# Patient Record
Sex: Female | Born: 1980 | Race: White | Hispanic: No | Marital: Married | Smoking: Never smoker
Health system: Southern US, Community
[De-identification: ages and names within clinical notes are randomized; demographics above are authoritative.]

---
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
## 2003-10-27 ENCOUNTER — Other Ambulatory Visit: Admission: RE | Admit: 2003-10-27 | Discharge: 2003-10-27 | Payer: Self-pay | Admitting: Gynecology

## 2005-04-14 ENCOUNTER — Other Ambulatory Visit: Admission: RE | Admit: 2005-04-14 | Discharge: 2005-04-14 | Payer: Self-pay | Admitting: Obstetrics and Gynecology

## 2005-09-19 ENCOUNTER — Other Ambulatory Visit: Admission: RE | Admit: 2005-09-19 | Discharge: 2005-09-19 | Payer: Self-pay | Admitting: Obstetrics and Gynecology

## 2006-03-07 ENCOUNTER — Inpatient Hospital Stay (HOSPITAL_COMMUNITY): Admission: AD | Admit: 2006-03-07 | Discharge: 2006-03-11 | Payer: Self-pay | Admitting: Obstetrics and Gynecology

## 2006-03-08 ENCOUNTER — Encounter (INDEPENDENT_AMBULATORY_CARE_PROVIDER_SITE_OTHER): Payer: Self-pay | Admitting: Specialist

## 2010-07-21 ENCOUNTER — Inpatient Hospital Stay (HOSPITAL_COMMUNITY): Admission: RE | Admit: 2010-07-21 | Discharge: 2010-07-23 | Payer: Self-pay | Admitting: Obstetrics and Gynecology

## 2011-01-19 LAB — CBC
HCT: 28.3 % — ABNORMAL LOW (ref 36.0–46.0)
Hemoglobin: 9.8 g/dL — ABNORMAL LOW (ref 12.0–15.0)
MCH: 29.7 pg (ref 26.0–34.0)
MCHC: 34.2 g/dL (ref 30.0–36.0)
MCHC: 34.6 g/dL (ref 30.0–36.0)
MCV: 85.9 fL (ref 78.0–100.0)
MCV: 85 fL (ref 78.0–100.0)
Platelets: 176 10*3/uL (ref 150–400)
Platelets: 237 10*3/uL (ref 150–400)
RBC: 3.29 MIL/uL — ABNORMAL LOW (ref 3.87–5.11)
RBC: 4.11 MIL/uL (ref 3.87–5.11)
RDW: 12.9 % (ref 11.5–15.5)
RDW: 13.2 % (ref 11.5–15.5)
RDW: 13 % (ref 11.5–15.5)
WBC: 7.9 10*3/uL (ref 4.0–10.5)
WBC: 8 10*3/uL (ref 4.0–10.5)

## 2011-01-19 LAB — URINALYSIS, ROUTINE W REFLEX MICROSCOPIC
Hgb urine dipstick: NEGATIVE
Specific Gravity, Urine: 1.02 (ref 1.005–1.030)
Urobilinogen, UA: 0.2 mg/dL (ref 0.0–1.0)

## 2011-01-19 LAB — SURGICAL PCR SCREEN
MRSA, PCR: NEGATIVE
Staphylococcus aureus: NEGATIVE

## 2011-01-19 LAB — COMPREHENSIVE METABOLIC PANEL
ALT: 10 U/L (ref 0–35)
AST: 15 U/L (ref 0–37)
Calcium: 8.7 mg/dL (ref 8.4–10.5)
GFR calc Af Amer: >60 mL/min (ref >60)
Glucose, Bld: 100 mg/dL — ABNORMAL HIGH (ref 70–99)
Sodium: 135 mEq/L (ref 135–145)
Total Protein: 5.4 g/dL — ABNORMAL LOW (ref 6.0–8.3)

## 2011-01-19 LAB — LACTATE DEHYDROGENASE: LDH: 143 U/L (ref 94–250)

## 2011-01-19 LAB — RPR: RPR Ser Ql: NONREACTIVE

## 2011-03-24 NOTE — Discharge Summary (Signed)
[name], Jo Schmitt             ACCOUNT NO.:  [id number]   MEDICAL RECORD NO.:  [id number]          PATIENT TYPE:  INP   LOCATION:  9134                          FACILITY:  [facility]   PHYSICIAN:  Duke Salvia. Marcelle Overlie, M.D.DATE OF BIRTH:  Jan 01, 1981   DATE OF ADMISSION:  03/07/2006  DATE OF DISCHARGE:  03/11/2006                                 DISCHARGE SUMMARY   ADMISSION DIAGNOSES:  1.  Intrauterine pregnancy at 57 weeks estimated gestational age.  2.  Oligohydramnios.   DISCHARGE DIAGNOSES:  1.  Status post low transverse cesarean section secondary to failed      induction.  2.  Viable female infant.   PROCEDURE:  Primary low transverse cesarean section.   REASON FOR ADMISSION:  Please see written history and physical.   HOSPITAL COURSE:  The patient is a 30 year old primigravida that was  admitted to [facility] at 36 weeks estimated gestational  age. The patient had been seen in the office for amniotic fluid index that  was less than the third percentile. The patient was now admitted for  induction of labor. After Cervidil, ripening of the cervix, and Pitocin  augmentation, the patient failed to change her cervix. Cervix remained  closed, 50% effaced, vertex presentation. Fetal heart tones were reactive  after a failed induction on the second day. Decision was made to proceed  with a primary low transverse cesarean section. The patient was then  transferred to the operating room where spinal anesthesia was administered  without difficulty. A low transverse incision was made to deliver the viable  female infant weighing 5 pounds, 5 ounces, Apgar's of 5 at 1 minute, 9 at 10  minutes. The patient tolerated the procedure well and was taken to the  recovery room in stable condition. On postoperative day 1, the patient was  without complaints. The baby was stable in the NICU on C-PAP. Vital signs  were stable. She was afebrile. Abdomen was soft with good return of  bowel  function. Fundus was firm and nontender. Abdominal dressing was noted to be  clean, dry, and intact. Laboratory findings revealed hemoglobin 10.2,  platelet count 270,000. White blood cell count 12.5. On postoperative day 2,  the patient was without complaints. Vital signs remained stable. She was  afebrile. Fundus was firm and nontender. Abdominal dressing had been removed  revealing incision that was clean, dry, and intact. Laboratory findings  revealed hemoglobin stable at 10.2. On postoperative day 3, the patient was  without complaints. Vital signs remained stable. Fundus was firm and  nontender. Incision was clean, dry, and intact. Staples were removed and  patient was later discharged home.   CONDITION ON DISCHARGE:  Good.   DIET:  Regular as tolerated.   ACTIVITY:  No heavy lifting. No driving x2 weeks or vaginal entry.   FOLLOWUP:  The patient to followup in the office in 1 to 2 weeks for  incision check. She is to call for temperature greater than 100 degrees,  persistent nausea and vomiting, heavy vaginal bleeding, and/or redness or  drainage from incisional site.   DISCHARGE MEDICATIONS:  1.  Tylox #30 1 p.o. q.4 to 6 hours p.r.n.  2.  Motrin 600 mg every 6 hours.  3.  Prenatal vitamins 1 p.o. daily.  4.  Colace 1 p.o. daily p.r.n.      Julio Sicks, N.P.      Richard M. Marcelle Overlie, M.D.  Electronically Signed    CC/MEDQ  D:  03/29/2006  T:  03/29/2006  Job:  [id number]

## 2011-03-24 NOTE — Op Note (Signed)
NAMEAVION, PATELLA NO.:  [id number]   MEDICAL RECORD NO.:  [id number]          PATIENT TYPE:  INP   LOCATION:  9134                          FACILITY:  [facility]   PHYSICIAN:  Juluis Mire, M.D.   DATE OF BIRTH:  [birth year]   DATE OF PROCEDURE:  03/08/2006  DATE OF DISCHARGE:                                 OPERATIVE REPORT   PREOPERATIVE DIAGNOSIS:  Intrauterine pregnancy at 36 weeks with  oligohydramnios and failed induction.   POSTOPERATIVE DIAGNOSIS:  Intrauterine pregnancy at 36 weeks with  oligohydramnios and failed induction.   OPERATIVE PROCEDURE:  Low transverse cesarean section.   SURGEON:  Juluis Mire, M.D.   ANESTHESIA:  Spinal.   ESTIMATED BLOOD LOSS:  400-500 mL.   PACKS AND DRAINS:  None.   INTRAOPERATIVE BLOOD REPLACED:  None.   COMPLICATIONS:  None.   INDICATIONS:  The patient is a 30 year old primigravida female at 2 weeks.  Came to the office where amniotic fluid index was less than the third  percentile.  She was admitted for induction.  After Cervidil ripened the  cervix and Pitocin all day up to 20 MU/min, the cervix failed to changed,  remained 50% and closed, vertex presenting.  Fluid remained intact.  Fetal  heart rate was stable.  We had discussed a second day of induction versus  primary cesarean section.  The patient was favoring the latter.  The risks  were discussed including the risk of infection.  Risk of hemorrhage could  require transfusion, risk of AIDS or hepatitis.  The risk of injury to  adjacent organs requiring further exploratory surgery.  The risk of deep  vein thrombosis and pulmonary embolus.  The patient expressed understanding  of the indications and risks.   PROCEDURE:  The patient was taken to the OR and placed in supine position  with left lateral tilt.  After satisfactory level of spinal anesthesia was  obtained, the abdomen was prepped out with Betadine and draped in a sterile  field.  A low  transverse skin incision was made with a knife and carried  through the subcutaneous tissue.  The fascia was entered sharply and  incision to the fascia laterally.  The fascia was taken off the muscles  superiorly and inferiorly.  Rectus muscles were separated in the midline.  The peritoneum was entered sharply and the incision in the peritoneum was  extended both superiorly and inferiorly.  Low transverse bladder flap was  developed.  A low transverse uterine incision was begun with a knife and  extended laterally using manual traction.  Amniotic fluid was clear.  The  infant presented in the vertex presentation.  It was delivered with  elevation of the head and fundal pressure.  The infant was a viable female.  Apgars were 5/9.  pH and weight are pending.  The placenta was delivered  manually and sent for pathologic review.  The uterus was wiped free of  remaining membranes and placenta.  The uterus was then closed with running  locking suture of 0 chromic using a two-layer closure technique.  We had  good hemostasis and clear urine output.  Tubes and ovaries were  unremarkable.  The muscles and peritoneum were closed with a running suture  of 3-0 Vicryl.  The fascia was closed with a running suture of 0 PDS.  The  skin was closed  with staples and Steri-Strips.  Sponge, instrument and needle count reported  as correct by the circulating nurse x 2.  The patient tolerated the  procedure well and returned to the recovery room in good condition.  Urine  output remained clear at the time of closure.      Juluis Mire, M.D.  Electronically Signed     JSM/MEDQ  D:  03/08/2006  T:  03/09/2006  Job:  [id number]

## 2016-12-13 ENCOUNTER — Ambulatory Visit: Payer: Self-pay | Admitting: Physician Assistant

## 2016-12-13 VITALS — BP 110/70 | HR 97 | Temp 98.1°F

## 2016-12-13 DIAGNOSIS — B349 Viral infection, unspecified: Secondary | ICD-10-CM

## 2016-12-13 NOTE — Progress Notes (Signed)
S: C/o hoarse voice with dry cough for 5 days, + fever, chills this weekend but none in 2 days, denies cp/sob, v/d; mucus was green this am but clear throughout the day, cough is sporadic, did get a flu vaccine  Using otc meds:   O: PE: vitals wnl, nad,  perrl eomi, normocephalic, tms dull, nasal mucosa red and swollen, throat injected, neck supple no lymph, lungs c t a, cv rrr, neuro intact,   A:  Acute viral illness   P: drink fluids, continue regular meds , use otc meds of choice, return if not improving in 5 days, return earlier if worsening , if voice is not back to normal by Monday 2-12 then will call in a steroid

## 2017-01-29 ENCOUNTER — Other Ambulatory Visit (HOSPITAL_COMMUNITY)
Admission: RE | Admit: 2017-01-29 | Discharge: 2017-01-29 | Disposition: A | Discharge status: Home / Self Care | Payer: Managed Care, Other (non HMO) | Source: Ambulatory Visit | Attending: Family Medicine | Admitting: Family Medicine

## 2017-01-29 ENCOUNTER — Other Ambulatory Visit: Payer: Self-pay | Admitting: Family Medicine

## 2017-01-29 DIAGNOSIS — Z124 Encounter for screening for malignant neoplasm of cervix: Secondary | ICD-10-CM | POA: Diagnosis present

## 2017-01-29 DIAGNOSIS — Z1151 Encounter for screening for human papillomavirus (HPV): Secondary | ICD-10-CM | POA: Diagnosis not present

## 2017-01-31 LAB — CYTOLOGY - PAP
DIAGNOSIS: NEGATIVE
HPV (WINDOPATH): NOT DETECTED

## 2018-12-30 ENCOUNTER — Ambulatory Visit: Payer: Self-pay | Admitting: Adult Health

## 2018-12-30 VITALS — BP 126/82 | HR 98 | Temp 98.4°F | Resp 16 | Ht 65.0 in | Wt 275.0 lb

## 2018-12-30 DIAGNOSIS — H6503 Acute serous otitis media, bilateral: Secondary | ICD-10-CM | POA: Insufficient documentation

## 2018-12-30 DIAGNOSIS — J0100 Acute maxillary sinusitis, unspecified: Secondary | ICD-10-CM

## 2018-12-30 MED ORDER — PREDNISONE 10 MG (21) PO TBPK: ORAL_TABLET | ORAL | Status: DC

## 2018-12-30 MED ORDER — BENZONATATE 200 MG PO CAPS: 200.0000 mg | ORAL_CAPSULE | Freq: Every evening | ORAL | 0 refills | Status: DC | PRN

## 2018-12-30 MED ORDER — AMOXICILLIN-POT CLAVULANATE 875-125 MG PO TABS: 1.0000 | ORAL_TABLET | Freq: Two times a day (BID) | ORAL | 0 refills | Status: DC

## 2018-12-30 NOTE — Progress Notes (Addendum)
Subjective:     Patient ID: Jo Schmitt, female   DOB: 05-18-1981, 38 y.o.   MRN: [id number]  Blood pressure 126/82, pulse 98, temperature 98.4 F (36.9 C), resp. rate 16, height 5\' 5"  (1.651 m), weight 275 lb (124.7 kg), SpO2 100 %.  Patient is a 38 year old female in no acute distress who comes to the clinic for complaints of sinus congestion x 1 month.   He reports she has tried on and off home remedies for the last month including essential oils and NyQuil. Patient's last menstrual period was 12/25/2018 (exact date).  Patient denies any chance of pregnancy.  Sinusitis  This is a new problem. The current episode started more than 1 month ago. The problem has been gradually improving since onset. The maximum temperature recorded prior to her arrival was 100.4 - 100.9 F. The fever has been present for 1 to 2 days. She is experiencing no pain. Associated symptoms include coughing, ear pain, sinus pressure, sneezing, a sore throat and swollen glands. Pertinent negatives include no chills, congestion, diaphoresis, headaches, hoarse voice, neck pain or shortness of breath. Past treatments include oral decongestants. The treatment provided no relief.    Reports postnasal drip cough.  He has had fatigue the last week with worsening facial pressure in the maxillary area bilaterally  Patient  denies any fever, body aches,chills, rash, chest pain, shortness of breath, nausea, vomiting, or diarrhea.       Review of Systems  Constitutional: Positive for fatigue. Negative for activity change, appetite change, chills, diaphoresis, fever and unexpected weight change.  HENT: Positive for ear pain, postnasal drip, rhinorrhea, sinus pressure, sneezing and sore throat. Negative for congestion, dental problem, drooling, ear discharge, facial swelling, hearing loss, hoarse voice, mouth sores, nosebleeds, sinus pain, tinnitus, trouble swallowing and voice change.   Eyes: Negative.   Respiratory: Positive  for cough. Negative for apnea, choking, chest tightness, shortness of breath, wheezing and stridor.   Cardiovascular: Negative.  Negative for chest pain, palpitations and leg swelling.  Gastrointestinal: Negative.   Genitourinary: Negative.   Musculoskeletal: Negative.  Negative for neck pain.  Skin: Negative.   Allergic/Immunologic: Negative.   Neurological: Negative.  Negative for headaches.  Hematological: Negative.   Psychiatric/Behavioral: Negative.        Objective:   Physical Exam Vitals signs reviewed.  Constitutional:      General: She is not in acute distress.    Appearance: Normal appearance. She is well-developed and well-groomed. She is not ill-appearing or diaphoretic.     Comments: Patient is alert and oriented and responsive to questions Engages in eye contact with provider. Speaks in full sentences without any pauses without any shortness of breath or distress.    HENT:     Head: Normocephalic and atraumatic.     Salivary Glands: Right salivary gland is not diffusely enlarged or tender. Left salivary gland is not diffusely enlarged or tender.     Right Ear: Hearing, ear canal and external ear normal. A middle ear effusion is present. Tympanic membrane is erythematous. Tympanic membrane is not perforated or retracted.     Left Ear: Hearing, ear canal and external ear normal. A middle ear effusion is present. Tympanic membrane is erythematous. Tympanic membrane is not perforated or retracted.     Nose: Mucosal edema, congestion and rhinorrhea present.     Right Sinus: Maxillary sinus tenderness present. No frontal sinus tenderness.     Left Sinus: Maxillary sinus tenderness present. No frontal sinus  tenderness.     Mouth/Throat:     Lips: Pink.     Mouth: Mucous membranes are moist.     Pharynx: Uvula midline. Posterior oropharyngeal erythema present. No uvula swelling.     Tonsils: No tonsillar exudate or tonsillar abscesses.  Eyes:     General: No scleral icterus.        Right eye: No discharge.        Left eye: No discharge.     Conjunctiva/sclera: Conjunctivae normal.     Pupils: Pupils are equal, round, and reactive to light.  Neck:     Musculoskeletal: Full passive range of motion without pain, normal range of motion and neck supple. No neck rigidity or muscular tenderness.     Trachea: Trachea and phonation normal.  Cardiovascular:     Rate and Rhythm: Normal rate and regular rhythm.     Heart sounds: No murmur. No friction rub. No gallop.   Pulmonary:     Effort: Pulmonary effort is normal. No respiratory distress.     Breath sounds: Normal breath sounds. No stridor. No wheezing, rhonchi or rales.  Chest:     Chest wall: No tenderness.  Abdominal:     Palpations: Abdomen is soft.  Musculoskeletal: Normal range of motion.     Comments: Patient moves on and off of exam table and in room without difficulty. Gait is normal in hall and in room. Patient is oriented to person place time and situation. Patient answers questions appropriately and engages in conversation.   Lymphadenopathy:     Cervical: Cervical adenopathy present.     Right cervical: Superficial cervical adenopathy present. No deep or posterior cervical adenopathy.    Left cervical: Superficial cervical adenopathy present. No deep or posterior cervical adenopathy.  Skin:    General: Skin is warm and dry.     Capillary Refill: Capillary refill takes less than 2 seconds.  Neurological:     General: No focal deficit present.     Mental Status: She is alert and oriented to person, place, and time.  Psychiatric:        Mood and Affect: Mood normal.        Behavior: Behavior normal. Behavior is cooperative.        Thought Content: Thought content normal.        Judgment: Judgment normal.        Assessment:     Acute non-recurrent maxillary sinusitis  Non-recurrent acute serous otitis media of both ears      Plan:     Method of birth control recommended while on  antibiotic in 1 week thereafter.  Flonase nasal spray per package instructions as well as Claritin daily per package instructions as recommended with allergy season approaching and patient reports history of allergies in the spring  Diagnoses and all orders for this visit:  Acute non-recurrent maxillary sinusitis  Non-recurrent acute serous otitis media of both ears  Other orders -     amoxicillin-clavulanate (AUGMENTIN) 875-125 MG tablet; Take 1 tablet by mouth 2 (two) times daily. -     predniSONE (STERAPRED UNI-PAK 21 TAB) 10 MG (21) TBPK tablet; PO: Take 6 tablets on day 1:Take 5 tablets day 2:Take 4 tablets day 3: Take 3 tablets day 4:Take 2 tablets day five: 5 Take 1 tablet day 6 -     benzonatate (TESSALON) 200 MG capsule; Take 1 capsule (200 mg total) by mouth at bedtime as needed for cough.   Gave and reviewed After  Visit Summary(AVS) with patient. Patient is advised to read the after visit summary as well and let the provider know if any question, concerns or clarifications are needed.   Patient is advised to keep regular primary care visits with her primary care provider.  Advised patient call the office or your primary care doctor for an appointment if no improvement within 72 hours or if any symptoms change or worsen at any time  Advised ER or urgent Care if after hours or on weekend. Call 911 for emergency symptoms at any time.Patinet verbalized understanding of all instructions given/reviewed and treatment plan and has no further questions or concerns at this time.    Patient verbalized understanding of all instructions given and denies any further questions at this time.

## 2018-12-30 NOTE — Patient Instructions (Signed)
Sinusitis, Adult Sinusitis is soreness and swelling (inflammation) of your sinuses. Sinuses are hollow spaces in the bones around your face. They are located:  Around your eyes.  In the middle of your forehead.  Behind your nose.  In your cheekbones. Your sinuses and nasal passages are lined with a fluid called mucus. Mucus drains out of your sinuses. Swelling can trap mucus in your sinuses. This lets germs (bacteria, virus, or fungus) grow, which leads to infection. Most of the time, this condition is caused by a virus. What are the causes? This condition is caused by:  Allergies.  Asthma.  Germs.  Things that block your nose or sinuses.  Growths in the nose (nasal polyps).  Chemicals or irritants in the air.  Fungus (rare). What increases the risk? You are more likely to develop this condition if:  You have a weak body defense system (immune system).  You do a lot of swimming or diving.  You use nasal sprays too much.  You smoke. What are the signs or symptoms? The main symptoms of this condition are pain and a feeling of pressure around the sinuses. Other symptoms include:  Stuffy nose (congestion).  Runny nose (drainage).  Swelling and warmth in the sinuses.  Headache.  Toothache.  A cough that may get worse at night.  Mucus that collects in the throat or the back of the nose (postnasal drip).  Being unable to smell and taste.  Being very tired (fatigue).  A fever.  Sore throat.  Bad breath. How is this diagnosed? This condition is diagnosed based on:  Your symptoms.  Your medical history.  A physical exam.  Tests to find out if your condition is short-term (acute) or long-term (chronic). Your doctor may: ? Check your nose for growths (polyps). ? Check your sinuses using a tool that has a light (endoscope). ? Check for allergies or germs. ? Do imaging tests, such as an MRI or CT scan. How is this treated? Treatment for this condition  depends on the cause and whether it is short-term or long-term.  If caused by a virus, your symptoms should go away on their own within 10 days. You may be given medicines to relieve symptoms. They include: ? Medicines that shrink swollen tissue in the nose. ? Medicines that treat allergies (antihistamines). ? A spray that treats swelling of the nostrils. ? Rinses that help get rid of thick mucus in your nose (nasal saline washes).  If caused by bacteria, your doctor may wait to see if you will get better without treatment. You may be given antibiotic medicine if you have: ? A very bad infection. ? A weak body defense system.  If caused by growths in the nose, you may need to have surgery. Follow these instructions at home: Medicines  Take, use, or apply over-the-counter and prescription medicines only as told by your doctor. These may include nasal sprays.  If you were prescribed an antibiotic medicine, take it as told by your doctor. Do not stop taking the antibiotic even if you start to feel better. Hydrate and humidify   Drink enough water to keep your pee (urine) pale yellow.  Use a cool mist humidifier to keep the humidity level in your home above 50%.  Breathe in steam for 10-15 minutes, 3-4 times a day, or as told by your doctor. You can do this in the bathroom while a hot shower is running.  Try not to spend time in cool or dry air.   Rest  Rest as much as you can.  Sleep with your head raised (elevated).  Make sure you get enough sleep each night. General instructions   Put a warm, moist washcloth on your face 3-4 times a day, or as often as told by your doctor. This will help with discomfort.  Wash your hands often with soap and water. If there is no soap and water, use hand sanitizer.  Do not smoke. Avoid being around people who are smoking (secondhand smoke).  Keep all follow-up visits as told by your doctor. This is important. Contact a doctor if:  You  have a fever.  Your symptoms get worse.  Your symptoms do not get better within 10 days. Get help right away if:  You have a very bad headache.  You cannot stop throwing up (vomiting).  You have very bad pain or swelling around your face or eyes.  You have trouble seeing.  You feel confused.  Your neck is stiff.  You have trouble breathing. Summary  Sinusitis is swelling of your sinuses. Sinuses are hollow spaces in the bones around your face.  This condition is caused by tissues in your nose that become inflamed or swollen. This traps germs. These can lead to infection.  If you were prescribed an antibiotic medicine, take it as told by your doctor. Do not stop taking it even if you start to feel better.  Keep all follow-up visits as told by your doctor. This is important. This information is not intended to replace advice given to you by your health care provider. Make sure you discuss any questions you have with your health care provider. Document Released: 04/10/2008 Document Revised: 03/25/2018 Document Reviewed: 03/25/2018 Elsevier Interactive Patient Education  2019 [employer]   Benzonatate capsules What is this medicine? BENZONATATE (ben ZOE na tate) is used to treat cough. This medicine may be used for other purposes; ask your health care provider or pharmacist if you have questions. COMMON BRAND NAME(S): Tessalon Perles, Zonatuss What should I tell my health care provider before I take this medicine? They need to know if you have any of these conditions: -kidney or liver disease -an unusual or allergic reaction to benzonatate, anesthetics, other medicines, foods, dyes, or preservatives -pregnant or trying to get pregnant -breast-feeding How should I use this medicine? Take this medicine by mouth with a glass of water. Follow the directions on the prescription label. Avoid breaking, chewing, or sucking the capsule, as this can cause serious side effects. Take  your medicine at regular intervals. Do not take your medicine more often than directed. Talk to your pediatrician regarding the use of this medicine in children. While this drug may be prescribed for children as young as 54 years old for selected conditions, precautions do apply. Overdosage: If you think you have taken too much of this medicine contact a poison control center or emergency room at once. NOTE: This medicine is only for you. Do not share this medicine with others. What if I miss a dose? If you miss a dose, take it as soon as you can. If it is almost time for your next dose, take only that dose. Do not take double or extra doses. What may interact with this medicine? Do not take this medicine with any of the following medications: -MAOIs like Carbex, Eldepryl, Marplan, Nardil, and Parnate This list may not describe all possible interactions. Give your health care provider a list of all the medicines, herbs, non-prescription drugs, or  dietary supplements you use. Also tell them if you smoke, drink alcohol, or use illegal drugs. Some items may interact with your medicine. What should I watch for while using this medicine? Tell your doctor if your symptoms do not improve or if they get worse. If you have a high fever, skin rash, or headache, see your health care professional. You may get drowsy or dizzy. Do not drive, use machinery, or do anything that needs mental alertness until you know how this medicine affects you. Do not sit or stand up quickly, especially if you are an older patient. This reduces the risk of dizzy or fainting spells. What side effects may I notice from receiving this medicine? Side effects that you should report to your doctor or health care professional as soon as possible: -allergic reactions like skin rash, itching or hives, swelling of the face, lips, or tongue -breathing problems -chest pain -confusion or hallucinations -irregular heartbeat -numbness of  mouth or throat -seizures Side effects that usually do not require medical attention (report to your doctor or health care professional if they continue or are bothersome): -burning feeling in the eyes -constipation -headache -nasal congestion -stomach upset This list may not describe all possible side effects. Call your doctor for medical advice about side effects. You may report side effects to FDA at 1-800-FDA-1088. Where should I keep my medicine? Keep out of the reach of children. Store at room temperature between 15 and 30 degrees C (59 and 86 degrees F). Keep tightly closed. Protect from light and moisture. Throw away any unused medicine after the expiration date. NOTE: This sheet is a summary. It may not cover all possible information. If you have questions about this medicine, talk to your doctor, pharmacist, or health care provider.  2019 Elsevier/Gold Standard (2008-01-22 14:52:56) Prednisolone tablets What is this medicine? PREDNISOLONE (pred NISS oh lone) is a corticosteroid. It is commonly used to treat inflammation of the skin, joints, lungs, and other organs. Common conditions treated include asthma, allergies, and arthritis. It is also used for other conditions, such as blood disorders and diseases of the adrenal glands. This medicine may be used for other purposes; ask your health care provider or pharmacist if you have questions. COMMON BRAND NAME(S): Millipred, Millipred DP, Millipred DP 12-Day, Millipred DP 6 Day, Prednoral What should I tell my health care provider before I take this medicine? They need to know if you have any of these conditions: -Cushing's syndrome -diabetes -glaucoma -heart problems or disease -high blood pressure -infection such as herpes, measles, tuberculosis, or chickenpox -kidney disease -liver disease -mental problems -myasthenia gravis -osteoporosis -seizures -stomach ulcer or intestine disease including colitis and  diverticulitis -thyroid problem -an unusual or allergic reaction to lactose, prednisolone, other medicines, foods, dyes, or preservatives -pregnant or trying to get pregnant -breast-feeding How should I use this medicine? Take this medicine by mouth with a glass of water. Follow the directions on the prescription label. Take it with food or milk to avoid stomach upset. If you are taking this medicine once a day, take it in the morning. Do not take more medicine than you are told to take. Do not suddenly stop taking your medicine because you may develop a severe reaction. Your doctor will tell you how much medicine to take. If your doctor wants you to stop the medicine, the dose may be slowly lowered over time to avoid any side effects. Talk to your pediatrician regarding the use of this medicine in children. Special care  may be needed. Overdosage: If you think you have taken too much of this medicine contact a poison control center or emergency room at once. NOTE: This medicine is only for you. Do not share this medicine with others. What if I miss a dose? If you miss a dose, take it as soon as you can. If it is almost time for your next dose, take only that dose. Do not take double or extra doses. What may interact with this medicine? Do not take this medicine with any of the following medications: -metyrapone -mifepristone This medicine may also interact with the following medications: -aminoglutethimide -amphotericin B -aspirin and aspirin-like medicines -barbiturates -certain medicines for diabetes, like glipizide or glyburide -cholestyramine -cholinesterase inhibitors -cyclosporine -digoxin -diuretics -ephedrine -female hormones, like estrogens and birth control pills -isoniazid -ketoconazole -NSAIDS, medicines for pain and inflammation, like ibuprofen or naproxen -phenytoin -rifampin -toxoids -vaccines -warfarin This list may not describe all possible interactions. Give  your health care provider a list of all the medicines, herbs, non-prescription drugs, or dietary supplements you use. Also tell them if you smoke, drink alcohol, or use illegal drugs. Some items may interact with your medicine. What should I watch for while using this medicine? Visit your doctor or health care professional for regular checks on your progress. If you are taking this medicine over a prolonged period, carry an identification card with your name and address, the type and dose of your medicine, and your doctor's name and address. This medicine may increase your risk of getting an infection. Tell your doctor or health care professional if you are around anyone with measles or chickenpox, or if you develop sores or blisters that do not heal properly. If you are going to have surgery, tell your doctor or health care professional that you have taken this medicine within the last twelve months. Ask your doctor or health care professional about your diet. You may need to lower the amount of salt you eat. This medicine may affect blood sugar levels. If you have diabetes, check with your doctor or health care professional before you change your diet or the dose of your diabetic medicine. What side effects may I notice from receiving this medicine? Side effects that you should report to your doctor or health care professional as soon as possible: -allergic reactions like skin rash, itching or hives, swelling of the face, lips, or tongue -changes in emotions or moods -changes in vision -eye pain -signs and symptoms of high blood sugar such as dizziness; dry mouth; dry skin; fruity breath; nausea; stomach pain; increased hunger or thirst; increased urination -signs and symptoms of infection like fever or chills; cough; sore throat; pain or trouble passing urine -slow growth in children (if used for longer periods of time) -swelling of ankles, feet -trouble sleeping -unusually weak or tired -weak  bones (if used for longer periods of time) Side effects that usually do not require medical attention (report to your doctor or health care professional if they continue or are bothersome): -increased hunger -nausea -skin problems, acne, thin and shiny skin -upset stomach -weight gain This list may not describe all possible side effects. Call your doctor for medical advice about side effects. You may report side effects to FDA at 1-800-FDA-1088. Where should I keep my medicine? Keep out of the reach of children. Store at room temperature between 15 and 30 degrees C (59 and 86 degrees F). Keep container tightly closed. Throw away any unused medicine after the expiration date. NOTE:  This sheet is a summary. It may not cover all possible information. If you have questions about this medicine, talk to your doctor, pharmacist, or health care provider.  2019 Elsevier/Gold Standard (2015-11-25 12:30:30) Amoxicillin; Clavulanic Acid tablets What is this medicine? AMOXICILLIN; CLAVULANIC ACID (a mox i SIL in; KLAV yoo lan ic AS id) is a penicillin antibiotic. It is used to treat certain kinds of bacterial infections. It will not work for colds, flu, or other viral infections. This medicine may be used for other purposes; ask your health care provider or pharmacist if you have questions. COMMON BRAND NAME(S): Augmentin What should I tell my health care provider before I take this medicine? They need to know if you have any of these conditions: -bowel disease, like colitis -kidney disease -liver disease -mononucleosis -an unusual or allergic reaction to amoxicillin, penicillin, cephalosporin, other antibiotics, clavulanic acid, other medicines, foods, dyes, or preservatives -pregnant or trying to get pregnant -breast-feeding How should I use this medicine? Take this medicine by mouth with a full glass of water. Follow the directions on the prescription label. Take at the start of a meal. Do not  crush or chew. If the tablet has a score line, you may cut it in half at the score line for easier swallowing. Take your medicine at regular intervals. Do not take your medicine more often than directed. Take all of your medicine as directed even if you think you are better. Do not skip doses or stop your medicine early. Talk to your pediatrician regarding the use of this medicine in children. Special care may be needed. Overdosage: If you think you have taken too much of this medicine contact a poison control center or emergency room at once. NOTE: This medicine is only for you. Do not share this medicine with others. What if I miss a dose? If you miss a dose, take it as soon as you can. If it is almost time for your next dose, take only that dose. Do not take double or extra doses. What may interact with this medicine? -allopurinol -anticoagulants -birth control pills -methotrexate -probenecid This list may not describe all possible interactions. Give your health care provider a list of all the medicines, herbs, non-prescription drugs, or dietary supplements you use. Also tell them if you smoke, drink alcohol, or use illegal drugs. Some items may interact with your medicine. What should I watch for while using this medicine? Tell your doctor or health care professional if your symptoms do not improve. Do not treat diarrhea with over the counter products. Contact your doctor if you have diarrhea that lasts more than 2 days or if it is severe and watery. If you have diabetes, you may get a false-positive result for sugar in your urine. Check with your doctor or health care professional. Birth control pills may not work properly while you are taking this medicine. Talk to your doctor about using an extra method of birth control. What side effects may I notice from receiving this medicine? Side effects that you should report to your doctor or health care professional as soon as possible: -allergic  reactions like skin rash, itching or hives, swelling of the face, lips, or tongue -breathing problems -dark urine -fever or chills, sore throat -redness, blistering, peeling or loosening of the skin, including inside the mouth -seizures -trouble passing urine or change in the amount of urine -unusual bleeding, bruising -unusually weak or tired -white patches or sores in the mouth or throat Side effects  that usually do not require medical attention (report to your doctor or health care professional if they continue or are bothersome): -diarrhea -dizziness -headache -nausea, vomiting -stomach upset -vaginal or anal irritation This list may not describe all possible side effects. Call your doctor for medical advice about side effects. You may report side effects to FDA at 1-800-FDA-1088. Where should I keep my medicine? Keep out of the reach of children. Store at room temperature below 25 degrees C (77 degrees F). Keep container tightly closed. Throw away any unused medicine after the expiration date. NOTE: This sheet is a summary. It may not cover all possible information. If you have questions about this medicine, talk to your doctor, pharmacist, or health care provider.  2019 Elsevier/Gold Standard (2008-01-16 12:04:30)

## 2018-12-31 NOTE — Progress Notes (Signed)
  Spoke with [facility] lead tech.  Patient had called this morning at 9:30 AM and reported that her prednisone dose pack did not come to pharmacy.  Pharmacy on file was called in [place]. Read prednisone taper pack instructions as documented in patient's chart yesterday to pharmacy lead tech above and read back to provider.  Patient was notified that prescription has been given to pharmacy. Follow-up as instructed. Patient verbalized understanding of all instructions given and denies any further questions at this time.

## 2019-01-08 ENCOUNTER — Other Ambulatory Visit: Payer: Self-pay

## 2019-01-08 ENCOUNTER — Ambulatory Visit
Admission: RE | Admit: 2019-01-08 | Discharge: 2019-01-08 | Disposition: A | Discharge status: Home / Self Care | Payer: Managed Care, Other (non HMO) | Source: Ambulatory Visit | Attending: Adult Health | Admitting: Adult Health

## 2019-01-08 ENCOUNTER — Ambulatory Visit
Admission: RE | Admit: 2019-01-08 | Discharge: 2019-01-08 | Disposition: A | Discharge status: Home / Self Care | Payer: Managed Care, Other (non HMO) | Attending: Adult Health | Admitting: Adult Health

## 2019-01-08 ENCOUNTER — Encounter: Payer: Self-pay | Admitting: Adult Health

## 2019-01-08 ENCOUNTER — Ambulatory Visit: Payer: Managed Care, Other (non HMO) | Admitting: Adult Health

## 2019-01-08 VITALS — BP 118/70 | HR 92 | Temp 98.0°F | Resp 16

## 2019-01-08 DIAGNOSIS — R05 Cough: Secondary | ICD-10-CM | POA: Diagnosis present

## 2019-01-08 DIAGNOSIS — R0982 Postnasal drip: Secondary | ICD-10-CM

## 2019-01-08 DIAGNOSIS — R059 Cough, unspecified: Secondary | ICD-10-CM

## 2019-01-08 DIAGNOSIS — J40 Bronchitis, not specified as acute or chronic: Secondary | ICD-10-CM

## 2019-01-08 MED ORDER — PREDNISONE 10 MG (21) PO TBPK: ORAL_TABLET | ORAL | 0 refills | Status: DC

## 2019-01-08 MED ORDER — BENZONATATE 100 MG PO CAPS: 100.0000 mg | ORAL_CAPSULE | Freq: Two times a day (BID) | ORAL | 0 refills | Status: DC | PRN

## 2019-01-08 MED ORDER — DOXYCYCLINE HYCLATE 100 MG PO TABS: 100.0000 mg | ORAL_TABLET | Freq: Two times a day (BID) | ORAL | 0 refills | Status: DC

## 2019-01-08 MED ORDER — IPRATROPIUM-ALBUTEROL 0.5-2.5 (3) MG/3ML IN SOLN
3.0000 mL | Freq: Once | RESPIRATORY_TRACT | Status: AC
  Administered 2019-01-08: 3 mL via RESPIRATORY_TRACT

## 2019-01-08 NOTE — Progress Notes (Signed)
[facility]  Subjective:     Patient ID: Jo Schmitt, female   DOB: Nov 05, 1981, 38 y.o.   MRN: [id number]  HPI  Blood pressure 118/70, pulse 92, temperature 98 F (36.7 C), temperature source Oral, resp. rate 16, last menstrual period 12/25/2018, SpO2 98 %.   She has one Augmentin left for tonight.  Patient is a 38 year old female in no acute distress who comes to the clinic  12/30/18 she was treated for sinusitis with Augmentin, benzonatate, prednisone for acute sinusitis and bilateral otitis media.   She now has Fatigue.   She reports cough has worsened over the past 3 days she reports yellow / clear sputum intermittently.   Patient  denies any fever, body aches,chills, rash, chest pain, shortness of breath, nausea, vomiting, or diarrhea.      Allergies  Allergen Reactions  . Zyrtec Allergy [Cetirizine Hcl]      No current outpatient medications on file.  Patient Active Problem List   Diagnosis Date Noted  . Acute non-recurrent maxillary sinusitis 12/30/2018  . Non-recurrent acute serous otitis media of both ears 12/30/2018    Review of Systems  Constitutional: Positive for fatigue. Negative for activity change, appetite change, chills, diaphoresis, fever and unexpected weight change.  HENT: Positive for congestion and rhinorrhea. Negative for dental problem, drooling, ear discharge, ear pain, facial swelling, hearing loss, mouth sores, nosebleeds, postnasal drip, sinus pressure, sinus pain, sneezing, sore throat, tinnitus, trouble swallowing and voice change.   Eyes: Negative for photophobia, pain, discharge, redness, itching and visual disturbance.  Respiratory: Positive for cough, chest tightness and wheezing. Negative for apnea, choking, shortness of breath and stridor.        Chest congestion clear to yellow sputum occasionally   Cardiovascular: Negative for chest pain, palpitations and leg swelling.  Gastrointestinal:  Negative.   Endocrine: Negative.   Genitourinary: Negative.   Musculoskeletal: Negative.   Skin: Negative.   Allergic/Immunologic: Negative for environmental allergies, food allergies and immunocompromised state.        -- Zyrtec Allergy (Cetirizine Hcl)  - causes jitteriness she reports   She denies any allergies to environmental exposures    Neurological: Negative.   Hematological: Negative.   Psychiatric/Behavioral: Negative.        Objective:   Physical Exam Vitals signs reviewed.  Constitutional:      General: She is not in acute distress.    Appearance: Normal appearance. She is not ill-appearing, toxic-appearing or diaphoretic.  HENT:     Head: Normocephalic and atraumatic.     Jaw: There is normal jaw occlusion.     Salivary Glands: Right salivary gland is not diffusely enlarged or tender. Left salivary gland is not diffusely enlarged or tender.     Right Ear: Ear canal and external ear normal. No drainage, swelling or tenderness. A middle ear effusion (much improved from last visit - fluid clear bilateral ) is present. There is no impacted cerumen. Tympanic membrane is not perforated, erythematous or bulging.     Left Ear: Ear canal and external ear normal. No drainage, swelling or tenderness. A middle ear effusion is present. There is no impacted cerumen. Tympanic membrane is not perforated, erythematous or bulging.     Nose: Rhinorrhea (clear ) present. No congestion.     Mouth/Throat:     Mouth: Mucous membranes are moist.     Pharynx: No oropharyngeal exudate or posterior oropharyngeal erythema.  Eyes:     General: No  scleral icterus.       Right eye: No discharge.        Left eye: No discharge.     Conjunctiva/sclera: Conjunctivae normal.  Neck:     Musculoskeletal: Normal range of motion and neck supple. No neck rigidity or muscular tenderness.  Cardiovascular:     Rate and Rhythm: Normal rate and regular rhythm.     Heart sounds: Normal heart sounds. No  murmur. No friction rub. No gallop.   Pulmonary:     Effort: Pulmonary effort is normal. No accessory muscle usage or respiratory distress.     Breath sounds: Normal air entry. No stridor, decreased air movement or transmitted upper airway sounds. Examination of the right-upper field reveals wheezing. Examination of the left-upper field reveals wheezing and rhonchi. Wheezing and rhonchi present. No decreased breath sounds or rales.     Comments: Bilateral upper lobe wheezing expiratory Mild rhonchi left upper lobe scattered   Wheezing resolved after Duo neb treatment as well as decrease in cough, mild rhonchi scattered in left upper lobe less than initial exam. 02 saturation 100% Heart rate 94  Chest:     Chest wall: No tenderness.  Abdominal:     Palpations: Abdomen is soft.  Musculoskeletal: Normal range of motion.  Lymphadenopathy:     Cervical: No cervical adenopathy.  Skin:    General: Skin is warm.     Capillary Refill: Capillary refill takes less than 2 seconds.  Neurological:     Mental Status: She is alert and oriented to person, place, and time.  Psychiatric:        Mood and Affect: Mood normal.        Behavior: Behavior normal.        Thought Content: Thought content normal.        Judgment: Judgment normal.        Assessment:    Bronchitis - Plan: ipratropium-albuterol (DUONEB) 0.5-2.5 (3) MG/3ML nebulizer solution 3 mL, DG Chest 2 View  Post-nasal drip  Cough      Plan:     Jo Schmitt was seen today for cough and fatigue.  Diagnoses and all orders for this visit:  Bronchitis -     ipratropium-albuterol (DUONEB) 0.5-2.5 (3) MG/3ML nebulizer solution 3 mL -     DG Chest 2 View; Future  Post-nasal drip  Cough  Other orders -     predniSONE (STERAPRED UNI-PAK 21 TAB) 10 MG (21) TBPK tablet; PO: Take 6 tablets on day 1:Take 5 tablets day 2:Take 4 tablets day 3: Take 3 tablets day 4:Take 2 tablets day five: 5 Take 1 tablet day 6 -     doxycycline  (VIBRA-TABS) 100 MG tablet; Take 1 tablet (100 mg total) by mouth 2 (two) times daily. -     benzonatate (TESSALON) 100 MG capsule; Take 1 capsule (100 mg total) by mouth 2 (two) times daily as needed for cough. May cause drowsiness  She will not take last dose of Augmentin and start the above.  Also recommend Claritin 10 mg daily and if she can not tolerate adult dose can try Childrens dose of mg daily. Flonase nasal spray daily per package instructions.  Offered CBC advised- patient declined lab work at this time.  She should follow up with her Sigmund Hazel, MD Primary care if symptoms are worsening or persist.   Advised patient call the office or your primary care doctor for an appointment if no improvement within 72 hours or if any symptoms change  or worsen at any time  Advised ER or urgent Care if after hours or on weekend. Call 911 for emergency symptoms at any time.Patinet verbalized understanding of all instructions given/reviewed and treatment plan and has no further questions or concerns at this time.    Patient verbalized understanding of all instructions given and denies any further questions at this time.

## 2019-01-08 NOTE — Patient Instructions (Addendum)
Advised patient call the office or your primary care doctor for an appointment if no improvement within 72 hours or if any symptoms change or worsen at any time  Advised ER or urgent Care if after hours or on weekend. Call 911 for emergency symptoms at any time.Patinet verbalized understanding of all instructions given/reviewed and treatment plan and has no further questions or concerns at this time.      Acute Bronchitis, Adult Acute bronchitis is when air tubes (bronchi) in the lungs suddenly get swollen. The condition can make it hard to breathe. It can also cause these symptoms:  A cough.  Coughing up clear, yellow, or green mucus.  Wheezing.  Chest congestion.  Shortness of breath.  A fever.  Body aches.  Chills.  A sore throat. Follow these instructions at home:  Medicines  Take over-the-counter and prescription medicines only as told by your doctor.  If you were prescribed an antibiotic medicine, take it as told by your doctor. Do not stop taking the antibiotic even if you start to feel better. General instructions  Rest.  Drink enough fluids to keep your pee (urine) pale yellow.  Avoid smoking and secondhand smoke. If you smoke and you need help quitting, ask your doctor. Quitting will help your lungs heal faster.  Use an inhaler, cool mist vaporizer, or humidifier as told by your doctor.  Keep all follow-up visits as told by your doctor. This is important. How is this prevented? To lower your risk of getting this condition again:  Wash your hands often with soap and water. If you cannot use soap and water, use hand sanitizer.  Avoid contact with people who have cold symptoms.  Try not to touch your hands to your mouth, nose, or eyes.  Make sure to get the flu shot every year. Contact a doctor if:  Your symptoms do not get better in 2 weeks. Get help right away if:  You cough up blood.  You have chest pain.  You have very bad shortness of  breath.  You become dehydrated.  You faint (pass out) or keep feeling like you are going to pass out.  You keep throwing up (vomiting).  You have a very bad headache.  Your fever or chills gets worse. This information is not intended to replace advice given to you by your health care provider. Make sure you discuss any questions you have with your health care provider. Document Released: 04/10/2008 Document Revised: 06/06/2017 Document Reviewed: 04/12/2016 Elsevier Interactive Patient Education  2019 [employer].   Postnasal Drip Postnasal drip is the feeling of mucus going down the back of your throat. Mucus is a slimy substance that moistens and cleans your nose and throat, as well as the air pockets in face bones near your forehead and cheeks (sinuses). Small amounts of mucus pass from your nose and sinuses down the back of your throat all the time. This is normal. When you produce too much mucus or the mucus gets too thick, you can feel it. Some common causes of postnasal drip include:  Having more mucus because of: ? A cold or the flu. ? Allergies. ? Cold air. ? Certain medicines.  Having more mucus that is thicker because of: ? A sinus or nasal infection. ? Dry air. ? A food allergy. Follow these instructions at home: Relieving discomfort   Gargle with a salt-water mixture 3-4 times a day or as needed. To make a salt-water mixture, completely dissolve -1 tsp of salt  in 1 cup of warm water.  If the air in your home is dry, use a humidifier to add moisture to the air.  Use a saline spray or container (neti pot) to flush out the nose (nasal irrigation). These methods can help clear away mucus and keep the nasal passages moist. General instructions  Take over-the-counter and prescription medicines only as told by your health care provider.  Follow instructions from your health care provider about eating or drinking restrictions. You may need to avoid caffeine.  Avoid  things that you know you are allergic to (allergens), like dust, mold, pollen, pets, or certain foods.  Drink enough fluid to keep your urine pale yellow.  Keep all follow-up visits as told by your health care provider. This is important. Contact a health care provider if:  You have a fever.  You have a sore throat.  You have difficulty swallowing.  You have headache.  You have sinus pain.  You have a cough that does not go away.  The mucus from your nose becomes thick and is green or yellow in color.  You have cold or flu symptoms that last more than 10 days. Summary  Postnasal drip is the feeling of mucus going down the back of your throat.  If your health care provider approves, use nasal irrigation or a nasal spray 2?4 times a day.  Avoid things that you know you are allergic to (allergens), like dust, mold, pollen, pets, or certain foods. This information is not intended to replace advice given to you by your health care provider. Make sure you discuss any questions you have with your health care provider. Document Released: 02/05/2017 Document Revised: 02/05/2017 Document Reviewed: 02/05/2017 Elsevier Interactive Patient Education  2019 [place] capsules What is this medicine? BENZONATATE (ben ZOE na tate) is used to treat cough. This medicine may be used for other purposes; ask your health care provider or pharmacist if you have questions. COMMON BRAND NAME(S): Tessalon Perles, Zonatuss What should I tell my health care provider before I take this medicine? They need to know if you have any of these conditions: -kidney or liver disease -an unusual or allergic reaction to benzonatate, anesthetics, other medicines, foods, dyes, or preservatives -pregnant or trying to get pregnant -breast-feeding How should I use this medicine? Take this medicine by mouth with a glass of water. Follow the directions on the prescription label. Avoid breaking,  chewing, or sucking the capsule, as this can cause serious side effects. Take your medicine at regular intervals. Do not take your medicine more often than directed. Talk to your pediatrician regarding the use of this medicine in children. While this drug may be prescribed for children as young as 73 years old for selected conditions, precautions do apply. Overdosage: If you think you have taken too much of this medicine contact a poison control center or emergency room at once. NOTE: This medicine is only for you. Do not share this medicine with others. What if I miss a dose? If you miss a dose, take it as soon as you can. If it is almost time for your next dose, take only that dose. Do not take double or extra doses. What may interact with this medicine? Do not take this medicine with any of the following medications: -MAOIs like Carbex, Eldepryl, Marplan, Nardil, and Parnate This list may not describe all possible interactions. Give your health care provider a list of all the medicines, herbs, non-prescription drugs, or dietary  supplements you use. Also tell them if you smoke, drink alcohol, or use illegal drugs. Some items may interact with your medicine. What should I watch for while using this medicine? Tell your doctor if your symptoms do not improve or if they get worse. If you have a high fever, skin rash, or headache, see your health care professional. You may get drowsy or dizzy. Do not drive, use machinery, or do anything that needs mental alertness until you know how this medicine affects you. Do not sit or stand up quickly, especially if you are an older patient. This reduces the risk of dizzy or fainting spells. What side effects may I notice from receiving this medicine? Side effects that you should report to your doctor or health care professional as soon as possible: -allergic reactions like skin rash, itching or hives, swelling of the face, lips, or tongue -breathing  problems -chest pain -confusion or hallucinations -irregular heartbeat -numbness of mouth or throat -seizures Side effects that usually do not require medical attention (report to your doctor or health care professional if they continue or are bothersome): -burning feeling in the eyes -constipation -headache -nasal congestion -stomach upset This list may not describe all possible side effects. Call your doctor for medical advice about side effects. You may report side effects to FDA at 1-800-FDA-1088. Where should I keep my medicine? Keep out of the reach of children. Store at room temperature between 15 and 30 degrees C (59 and 86 degrees F). Keep tightly closed. Protect from light and moisture. Throw away any unused medicine after the expiration date. NOTE: This sheet is a summary. It may not cover all possible information. If you have questions about this medicine, talk to your doctor, pharmacist, or health care provider.  2019 Elsevier/Gold Standard (2008-01-22 14:52:56) Doxycycline tablets or capsules What is this medicine? DOXYCYCLINE (dox i SYE kleen) is a tetracycline antibiotic. It kills certain bacteria or stops their growth. It is used to treat many kinds of infections, like dental, skin, respiratory, and urinary tract infections. It also treats acne, Lyme disease, malaria, and certain sexually transmitted infections. This medicine may be used for other purposes; ask your health care provider or pharmacist if you have questions. COMMON BRAND NAME(S): Acticlate, Adoxa, Adoxa CK, Adoxa Pak, Adoxa TT, Alodox, Avidoxy, Doxal, LYMEPAK, Mondoxyne NL, Monodox, Morgidox 1x, Morgidox 1x Kit, Morgidox 2x, Morgidox 2x Kit, NutriDox, Ocudox, TARGADOX, Vibra-Tabs, Vibramycin What should I tell my health care provider before I take this medicine? They need to know if you have any of these conditions: -liver disease -long exposure to sunlight like working outdoors -stomach problems like  colitis -an unusual or allergic reaction to doxycycline, tetracycline antibiotics, other medicines, foods, dyes, or preservatives -pregnant or trying to get pregnant -breast-feeding How should I use this medicine? Take this medicine by mouth with a full glass of water. Follow the directions on the prescription label. It is best to take this medicine without food, but if it upsets your stomach take it with food. Take your medicine at regular intervals. Do not take your medicine more often than directed. Take all of your medicine as directed even if you think you are better. Do not skip doses or stop your medicine early. Talk to your pediatrician regarding the use of this medicine in children. While this drug may be prescribed for selected conditions, precautions do apply. Overdosage: If you think you have taken too much of this medicine contact a poison control center or emergency room at  once. NOTE: This medicine is only for you. Do not share this medicine with others. What if I miss a dose? If you miss a dose, take it as soon as you can. If it is almost time for your next dose, take only that dose. Do not take double or extra doses. What may interact with this medicine? -antacids -barbiturates -birth control pills -bismuth subsalicylate -carbamazepine -methoxyflurane -other antibiotics -phenytoin -vitamins that contain iron -warfarin This list may not describe all possible interactions. Give your health care provider a list of all the medicines, herbs, non-prescription drugs, or dietary supplements you use. Also tell them if you smoke, drink alcohol, or use illegal drugs. Some items may interact with your medicine. What should I watch for while using this medicine? Tell your doctor or health care professional if your symptoms do not improve. Do not treat diarrhea with over the counter products. Contact your doctor if you have diarrhea that lasts more than 2 days or if it is severe and  watery. Do not take this medicine just before going to bed. It may not dissolve properly when you lay down and can cause pain in your throat. Drink plenty of fluids while taking this medicine to also help reduce irritation in your throat. This medicine can make you more sensitive to the sun. Keep out of the sun. If you cannot avoid being in the sun, wear protective clothing and use sunscreen. Do not use sun lamps or tanning beds/booths. Birth control pills may not work properly while you are taking this medicine. Talk to your doctor about using an extra method of birth control. If you are being treated for a sexually transmitted infection, avoid sexual contact until you have finished your treatment. Your sexual partner may also need treatment. Avoid antacids, aluminum, calcium, magnesium, and iron products for 4 hours before and 2 hours after taking a dose of this medicine. If you are using this medicine to prevent malaria, you should still protect yourself from contact with mosquitos. Stay in screened-in areas, use mosquito nets, keep your body covered, and use an insect repellent. What side effects may I notice from receiving this medicine? Side effects that you should report to your doctor or health care professional as soon as possible: -allergic reactions like skin rash, itching or hives, swelling of the face, lips, or tongue -difficulty breathing -fever -itching in the rectal or genital area -pain on swallowing -redness, blistering, peeling or loosening of the skin, including inside the mouth -severe stomach pain or cramps -unusual bleeding or bruising -unusually weak or tired -yellowing of the eyes or skin Side effects that usually do not require medical attention (report to your doctor or health care professional if they continue or are bothersome): -diarrhea -loss of appetite -nausea, vomiting This list may not describe all possible side effects. Call your doctor for medical advice  about side effects. You may report side effects to FDA at 1-800-FDA-1088. Where should I keep my medicine? Keep out of the reach of children. Store at room temperature, below 30 degrees C (86 degrees F). Protect from light. Keep container tightly closed. Throw away any unused medicine after the expiration date. Taking this medicine after the expiration date can make you seriously ill. NOTE: This sheet is a summary. It may not cover all possible information. If you have questions about this medicine, talk to your doctor, pharmacist, or health care provider.  2019 Elsevier/Gold Standard (2015-11-24 17:11:22) Loratadine capsules or tablets What is this medicine? LORATADINE (lor  AT a deen) is an antihistamine. It helps to relieve sneezing, runny nose, and itchy, watery eyes. This medicine is used to treat the symptoms of allergies. It is also used to treat itchy skin rash and hives. This medicine may be used for other purposes; ask your health care provider or pharmacist if you have questions. COMMON BRAND NAME(S): Alavert, Allergy Relief, Claritin, Claritin Hives Relief, Claritin Liqui-Gel, Claritin-D 24 Hour, Clear-Atadine, QlearQuil All Day & All Night Allergy Relief, Tavist ND What should I tell my health care provider before I take this medicine? They need to know if you have any of these conditions: -asthma -kidney disease -liver disease -an unusual or allergic reaction to loratadine, other antihistamines, other medicines, foods, dyes, or preservatives -pregnant or trying to get pregnant -breast-feeding How should I use this medicine? Take this medicine by mouth with a glass of water. Follow the directions on the label. You may take this medicine with food or on an empty stomach. Take your medicine at regular intervals. Do not take your medicine more often than directed. Talk to your pediatrician regarding the use of this medicine in children. While this medicine may be used in children as  young as 6 years for selected conditions, precautions do apply. Overdosage: If you think you have taken too much of this medicine contact a poison control center or emergency room at once. NOTE: This medicine is only for you. Do not share this medicine with others. What if I miss a dose? If you miss a dose, take it as soon as you can. If it is almost time for your next dose, take only that dose. Do not take double or extra doses. What may interact with this medicine? -other medicines for colds or allergies This list may not describe all possible interactions. Give your health care provider a list of all the medicines, herbs, non-prescription drugs, or dietary supplements you use. Also tell them if you smoke, drink alcohol, or use illegal drugs. Some items may interact with your medicine. What should I watch for while using this medicine? Tell your doctor or healthcare professional if your symptoms do not start to get better or if they get worse. Your mouth may get dry. Chewing sugarless gum or sucking hard candy, and drinking plenty of water may help. Contact your doctor if the problem does not go away or is severe. You may get drowsy or dizzy. Do not drive, use machinery, or do anything that needs mental alertness until you know how this medicine affects you. Do not stand or sit up quickly, especially if you are an older patient. This reduces the risk of dizzy or fainting spells. What side effects may I notice from receiving this medicine? Side effects that you should report to your doctor or health care professional as soon as possible: -allergic reactions like skin rash, itching or hives, swelling of the face, lips, or tongue -breathing problems -unusually restless or nervous Side effects that usually do not require medical attention (report to your doctor or health care professional if they continue or are bothersome): -drowsiness -dry or irritated mouth or throat -headache This list may not  describe all possible side effects. Call your doctor for medical advice about side effects. You may report side effects to FDA at 1-800-FDA-1088. Where should I keep my medicine? Keep out of the reach of children. Store at room temperature between 2 and 30 degrees C (36 and 86 degrees F). Protect from moisture. Throw away any unused medicine  after the expiration date. NOTE: This sheet is a summary. It may not cover all possible information. If you have questions about this medicine, talk to your doctor, pharmacist, or health care provider.  2019 Elsevier/Gold Standard (2008-04-27 17:17:24)

## 2019-01-09 ENCOUNTER — Telehealth: Payer: Self-pay | Admitting: Adult Health

## 2019-01-09 MED ORDER — FLUCONAZOLE 150 MG PO TABS: ORAL_TABLET | ORAL | 0 refills | Status: DC

## 2019-01-09 NOTE — Telephone Encounter (Signed)
DG Chest 2 View [[id number]] Resulted: 01/09/19 0358  Order Status: Completed Updated: 01/09/19 0401  Narrative:   CLINICAL DATA: Worsening cough  EXAM: CHEST - 2 VIEW  COMPARISON: None.  FINDINGS: The heart size and mediastinal contours are within normal limits. Both lungs are clear. The visualized skeletal structures are unremarkable.  IMPRESSION: No active cardiopulmonary disease.   Electronically Signed By: Jasmine Pang M.D. On: 01/09/2019 03:58   Called patient with results of above chest x-ray which is normal.  Will keep same office plan and seek care if any symptoms change or worsen.  Patient also requests Diflucan due to antibiotic-induced yeast, denies any other symptoms besides vaginal itching. She will seek gynecology care if symptoms persist or do not improve with treatment or if worsen at any time.  Meds ordered this encounter  Medications  . fluconazole (DIFLUCAN) 150 MG tablet    Sig: Take 1 tablet by mouth. For yeast only may repeat dose in 4 days if symptoms not resolved.    Dispense:  2 tablet    Refill:  0

## 2019-01-09 NOTE — Progress Notes (Signed)
Patient was called 01/09/19 see phone call in epic.  Normal chest x ray

## 2019-02-26 NOTE — Addendum Note (Signed)
Addended by: Berniece Pap on: 02/26/2019 02:08 PM   Modules accepted: Level of Service

## 2020-04-05 IMAGING — CR DG CHEST 2V
1 series · 2 of 2 positions shown · non-contrast
Comparison: None.

CLINICAL DATA: Worsening cough

EXAM:
CHEST - 2 VIEW

[Series 1: dg chest 2 view · 0.14mm/px · 2 of 2 slices shown]
[im 1/2]
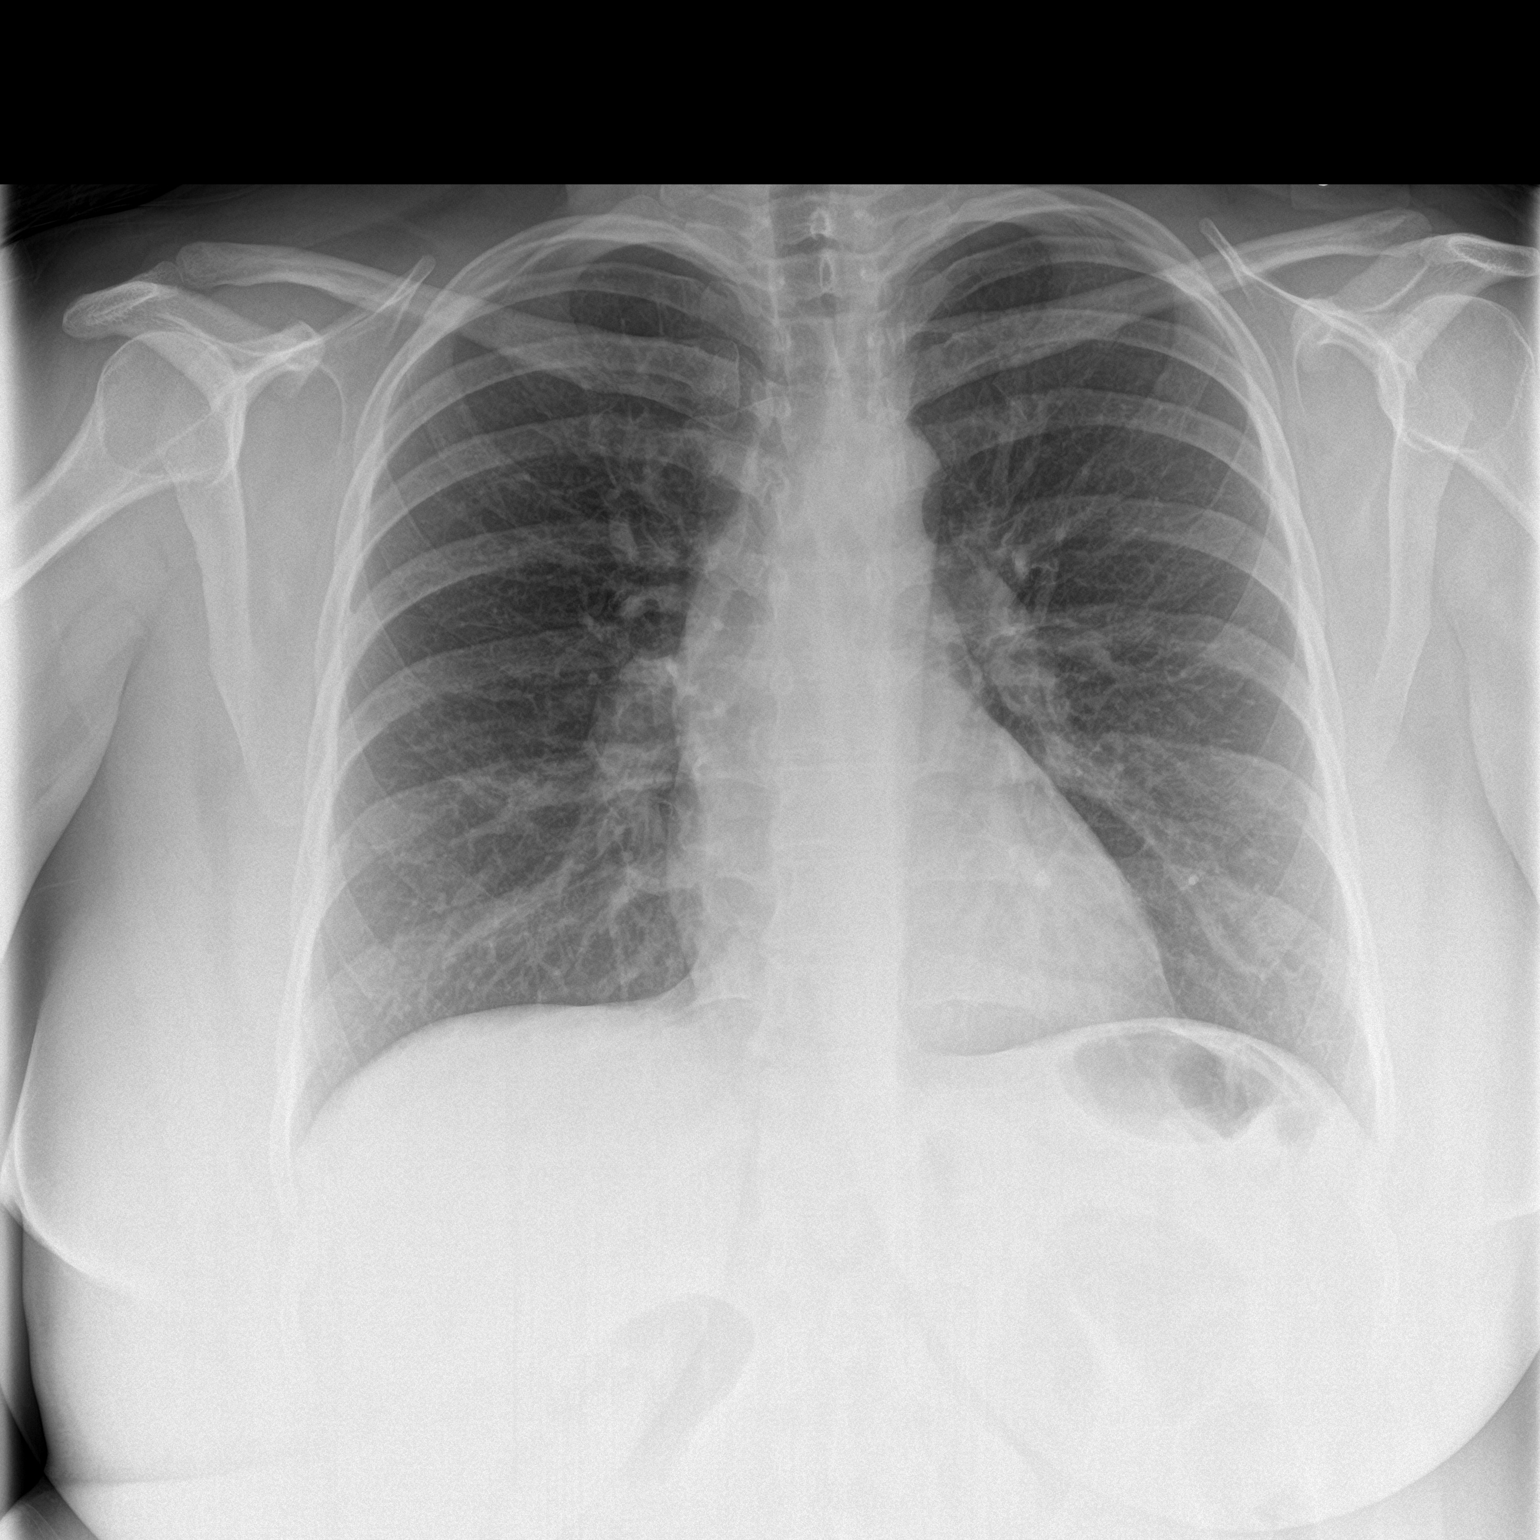
[im 2/2]
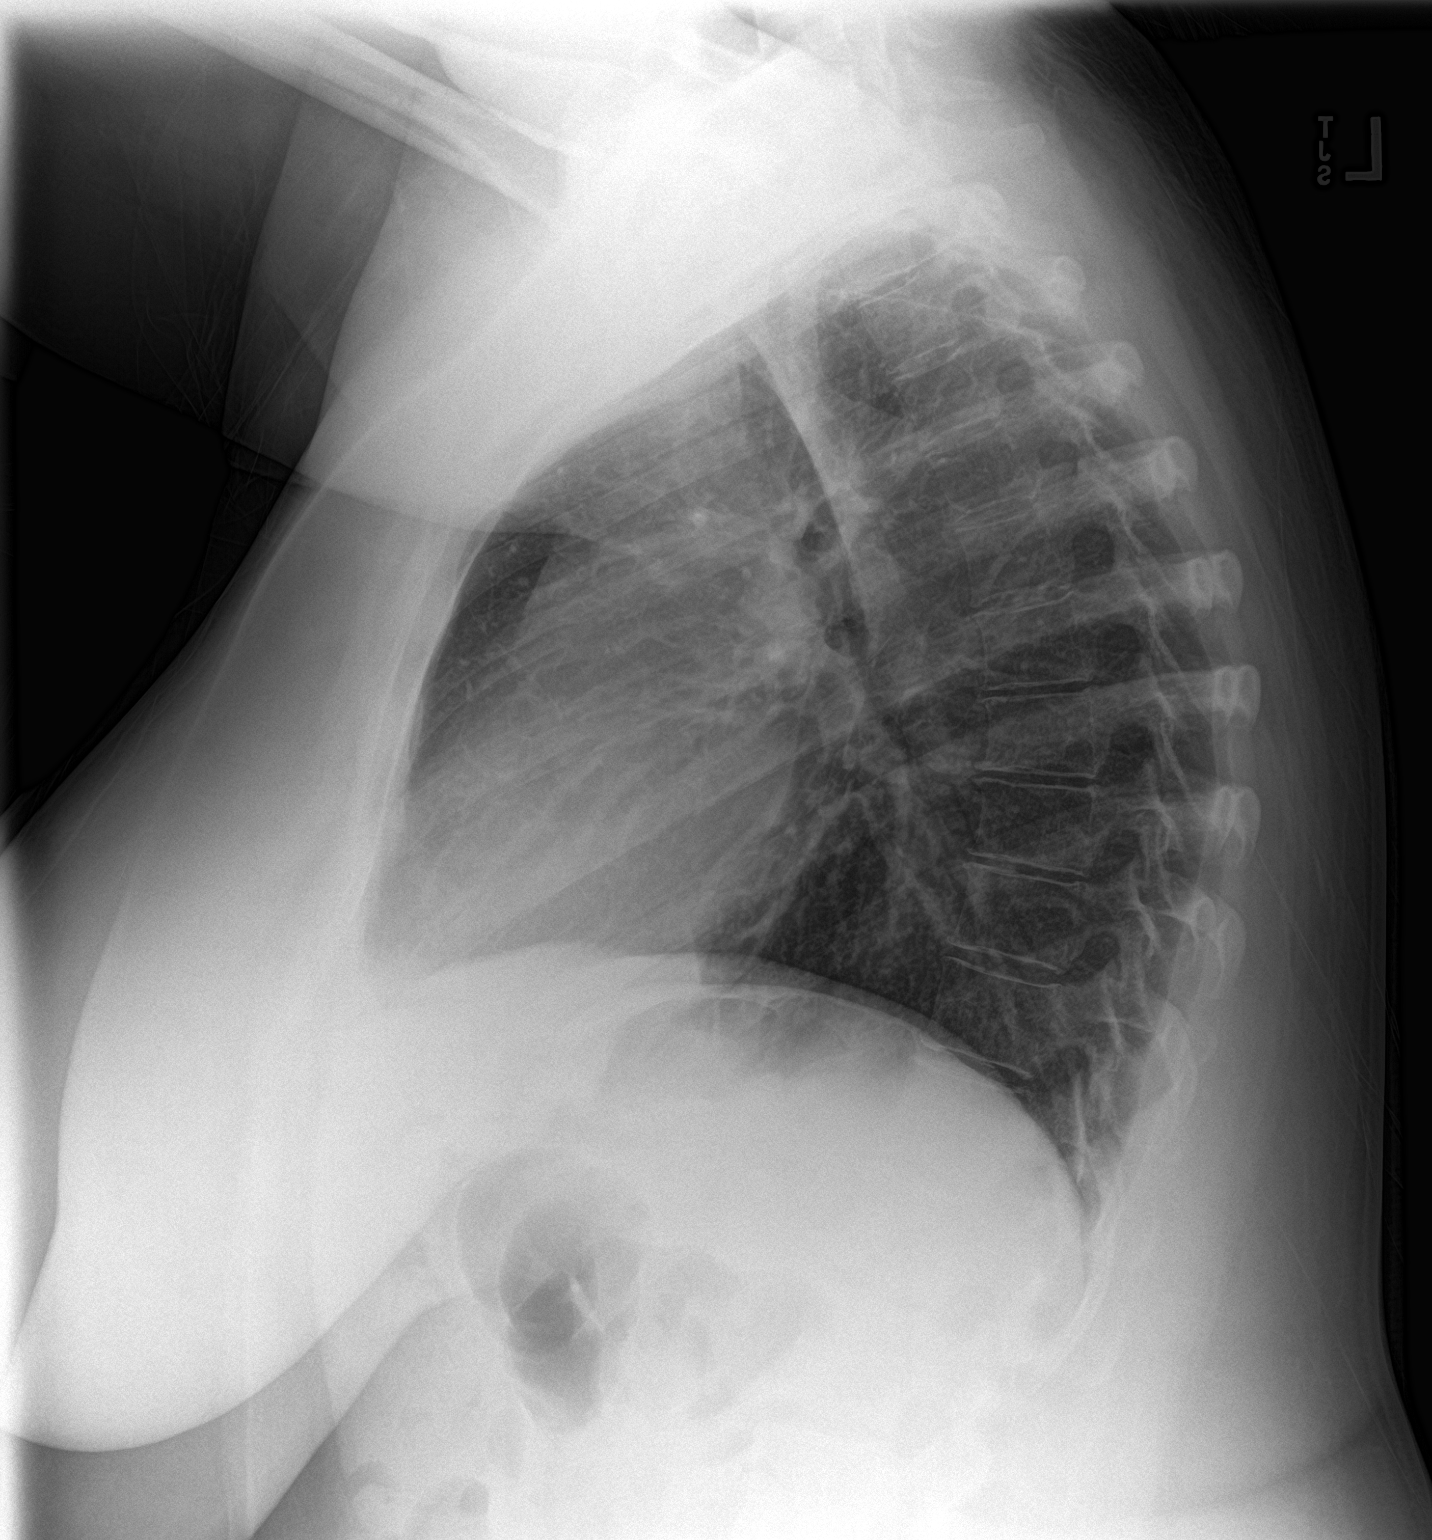

[2 of 2 positions shown; findings below may reference images not displayed]

FINDINGS: The heart size and mediastinal contours are within normal limits.
Both lungs are clear. The visualized skeletal structures are
unremarkable.
IMPRESSION: No active cardiopulmonary disease.

## 2022-05-04 ENCOUNTER — Telehealth: Payer: Managed Care, Other (non HMO) | Admitting: Physician Assistant

## 2022-05-04 DIAGNOSIS — B9689 Other specified bacterial agents as the cause of diseases classified elsewhere: Secondary | ICD-10-CM | POA: Diagnosis not present

## 2022-05-04 DIAGNOSIS — J019 Acute sinusitis, unspecified: Secondary | ICD-10-CM

## 2022-05-04 MED ORDER — BENZONATATE 100 MG PO CAPS: 100.0000 mg | ORAL_CAPSULE | Freq: Three times a day (TID) | ORAL | 0 refills | Status: AC | PRN

## 2022-05-04 MED ORDER — AMOXICILLIN-POT CLAVULANATE 875-125 MG PO TABS: 1.0000 | ORAL_TABLET | Freq: Two times a day (BID) | ORAL | 0 refills | Status: AC

## 2022-05-04 NOTE — Progress Notes (Signed)
Virtual Visit Consent   [name], you are scheduled for a virtual visit with a [facility] provider today. Just as with appointments in the office, your consent must be obtained to participate. Your consent will be active for this visit and any virtual visit you may have with one of our providers in the next 365 days. If you have a MyChart account, a copy of this consent can be sent to you electronically.  As this is a virtual visit, video technology does not allow for your provider to perform a traditional examination. This may limit your provider's ability to fully assess your condition. If your provider identifies any concerns that need to be evaluated in person or the need to arrange testing (such as labs, EKG, etc.), we will make arrangements to do so. Although advances in technology are sophisticated, we cannot ensure that it will always work on either your end or our end. If the connection with a video visit is poor, the visit may have to be switched to a telephone visit. With either a video or telephone visit, we are not always able to ensure that we have a secure connection.  By engaging in this virtual visit, you consent to the provision of healthcare and authorize for your insurance to be billed (if applicable) for the services provided during this visit. Depending on your insurance coverage, you may receive a charge related to this service.  I need to obtain your verbal consent now. Are you willing to proceed with your visit today? Jo Schmitt has provided verbal consent on 05/04/2022 for a virtual visit (video or telephone). [name], [place]  Date: 05/04/2022 11:06 AM  Virtual Visit via Video Note   I, [name], connected with  Jo Schmitt  ([id number], 1981/07/20) on 05/04/22 at 10:45 AM EDT by a video-enabled telemedicine application and verified that I am speaking with the correct person using two identifiers.  Location: Patient: Virtual Visit  Location Patient: Home Provider: Virtual Visit Location Provider: Home Office   I discussed the limitations of evaluation and management by telemedicine and the availability of in person appointments. The patient expressed understanding and agreed to proceed.    History of Present Illness: Jo Schmitt is a 41 y.o. who identifies as a female who was assigned female at birth, and is being seen today for [place] URI symptoms after COVID-19.   Initially thought was allergies about 4 weeks ago -- nasal and head congestion, improved. Then ended up with COVID-19 -- breathing fine, ears are ok. Now sinus congestion, sinus pressure, Continued cough that is worse in the afternoons. Marland Kitchen  HPI: HPI  Problems:  Patient Active Problem List   Diagnosis Date Noted   Acute non-recurrent maxillary sinusitis 12/30/2018   Non-recurrent acute serous otitis media of both ears 12/30/2018    Allergies:  Allergies  Allergen Reactions   Zyrtec Allergy [Cetirizine Hcl]    Medications:  Current Outpatient Medications:    amoxicillin-clavulanate (AUGMENTIN) 875-125 MG tablet, Take 1 tablet by mouth 2 (two) times daily., Disp: 14 tablet, Rfl: 0   benzonatate (TESSALON) 100 MG capsule, Take 1 capsule (100 mg total) by mouth 3 (three) times daily as needed for cough., Disp: 30 capsule, Rfl: 0   phentermine (ADIPEX-P) 37.5 MG tablet, 1 tablet before breakfast, Disp: , Rfl:   Observations/Objective: Patient is well-developed, well-nourished in no acute distress.  Resting comfortably at home.  Head is normocephalic, atraumatic.  No labored breathing. Speech is  clear and coherent with logical content.  Patient is alert and oriented at baseline.   Assessment and Plan: 1. Acute bacterial sinusitis - benzonatate (TESSALON) 100 MG capsule; Take 1 capsule (100 mg total) by mouth 3 (three) times daily as needed for cough.  Dispense: 30 capsule; Refill: 0 - amoxicillin-clavulanate (AUGMENTIN) 875-125 MG tablet; Take  1 tablet by mouth 2 (two) times daily.  Dispense: 14 tablet; Refill: 0  Rx Augmentin.  Increase fluids.  Rest.  Saline nasal spray.  Probiotic.  Mucinex as directed.  Humidifier in bedroom. Tessalon per orders. Continue allergy medications.  Call or return to clinic if symptoms are not improving.   Follow Up Instructions: I discussed the assessment and treatment plan with the patient. The patient was provided an opportunity to ask questions and all were answered. The patient agreed with the plan and demonstrated an understanding of the instructions.  A copy of instructions were sent to the patient via MyChart unless otherwise noted below.   The patient was advised to call back or seek an in-person evaluation if the symptoms worsen or if the condition fails to improve as anticipated.  Time:  I spent 10 minutes with the patient via telehealth technology discussing the above problems/concerns.    [name], PA-C

## 2022-05-04 NOTE — Patient Instructions (Signed)
Jo Schmitt, thank you for joining [name], PA-C for today's virtual visit.  While this provider is not your primary care provider (PCP), if your PCP is located in our provider database this encounter information will be shared with them immediately following your visit.  Consent: (Patient) Jo Schmitt provided verbal consent for this virtual visit at the beginning of the encounter.  Current Medications:  Current Outpatient Medications:    amoxicillin-clavulanate (AUGMENTIN) 875-125 MG tablet, Take 1 tablet by mouth 2 (two) times daily., Disp: 14 tablet, Rfl: 0   benzonatate (TESSALON) 100 MG capsule, Take 1 capsule (100 mg total) by mouth 3 (three) times daily as needed for cough., Disp: 30 capsule, Rfl: 0   phentermine (ADIPEX-P) 37.5 MG tablet, 1 tablet before breakfast, Disp: , Rfl:    Medications ordered in this encounter:  Meds ordered this encounter  Medications   benzonatate (TESSALON) 100 MG capsule    Sig: Take 1 capsule (100 mg total) by mouth 3 (three) times daily as needed for cough.    Dispense:  30 capsule    Refill:  0    Order Specific Question:   Supervising Provider    Answer:   Hyacinth Meeker, BRIAN [[id number]]   amoxicillin-clavulanate (AUGMENTIN) 875-125 MG tablet    Sig: Take 1 tablet by mouth 2 (two) times daily.    Dispense:  14 tablet    Refill:  0    Order Specific Question:   Supervising Provider    Answer:   Hyacinth Meeker, BRIAN [[id number]]     *If you need refills on other medications prior to your next appointment, please contact your pharmacy*  Follow-Up: Call back or seek an in-person evaluation if the symptoms worsen or if the condition fails to improve as anticipated.  Other Instructions Please take antibiotic as directed.  Increase fluid intake.  Use Saline nasal spray.  Take a daily multivitamin. .  Place a humidifier in the bedroom.  Please call or return clinic if symptoms are not improving.  Sinusitis Sinusitis is redness, soreness, and  swelling (inflammation) of the paranasal sinuses. Paranasal sinuses are air pockets within the bones of your face (beneath the eyes, the middle of the forehead, or above the eyes). In healthy paranasal sinuses, mucus is able to drain out, and air is able to circulate through them by way of your nose. However, when your paranasal sinuses are inflamed, mucus and air can become trapped. This can allow bacteria and other germs to grow and cause infection. Sinusitis can develop quickly and last only a short time (acute) or continue over a long period (chronic). Sinusitis that lasts for more than 12 weeks is considered chronic.  CAUSES  Causes of sinusitis include: Allergies. Structural abnormalities, such as displacement of the cartilage that separates your nostrils (deviated septum), which can decrease the air flow through your nose and sinuses and affect sinus drainage. Functional abnormalities, such as when the small hairs (cilia) that line your sinuses and help remove mucus do not work properly or are not present. SYMPTOMS  Symptoms of acute and chronic sinusitis are the same. The primary symptoms are pain and pressure around the affected sinuses. Other symptoms include: Upper toothache. Earache. Headache. Bad breath. Decreased sense of smell and taste. A cough, which worsens when you are lying flat. Fatigue. Fever. Thick drainage from your nose, which often is green and may contain pus (purulent). Swelling and warmth over the affected sinuses. DIAGNOSIS  Your caregiver will perform a physical exam.  During the exam, your caregiver may: Look in your nose for signs of abnormal growths in your nostrils (nasal polyps). Tap over the affected sinus to check for signs of infection. View the inside of your sinuses (endoscopy) with a special imaging device with a light attached (endoscope), which is inserted into your sinuses. If your caregiver suspects that you have chronic sinusitis, one or more of  the following tests may be recommended: Allergy tests. Nasal culture A sample of mucus is taken from your nose and sent to a lab and screened for bacteria. Nasal cytology A sample of mucus is taken from your nose and examined by your caregiver to determine if your sinusitis is related to an allergy. TREATMENT  Most cases of acute sinusitis are related to a viral infection and will resolve on their own within 10 days. Sometimes medicines are prescribed to help relieve symptoms (pain medicine, decongestants, nasal steroid sprays, or saline sprays).  However, for sinusitis related to a bacterial infection, your caregiver will prescribe antibiotic medicines. These are medicines that will help kill the bacteria causing the infection.  Rarely, sinusitis is caused by a fungal infection. In theses cases, your caregiver will prescribe antifungal medicine. For some cases of chronic sinusitis, surgery is needed. Generally, these are cases in which sinusitis recurs more than 3 times per year, despite other treatments. HOME CARE INSTRUCTIONS  Drink plenty of water. Water helps thin the mucus so your sinuses can drain more easily. Use a humidifier. Inhale steam 3 to 4 times a day (for example, sit in the bathroom with the shower running). Apply a warm, moist washcloth to your face 3 to 4 times a day, or as directed by your caregiver. Use saline nasal sprays to help moisten and clean your sinuses. Take over-the-counter or prescription medicines for pain, discomfort, or fever only as directed by your caregiver. SEEK IMMEDIATE MEDICAL CARE IF: You have increasing pain or severe headaches. You have nausea, vomiting, or drowsiness. You have swelling around your face. You have vision problems. You have a stiff neck. You have difficulty breathing. MAKE SURE YOU:  Understand these instructions. Will watch your condition. Will get help right away if you are not doing well or get worse. Document Released:  10/23/2005 Document Revised: 01/15/2012 Document Reviewed: 11/07/2011 [facility] Patient Information 2014 [place], [place].    If you have been instructed to have an in-person evaluation today at a local Urgent Care facility, please use the link below. It will take you to a list of all of our available [facility], including address, phone number and hours of operation. Please do not delay care.  [facility]  If you or a family member do not have a primary care provider, use the link below to schedule a visit and establish care. When you choose a [facility] primary care physician or advanced practice provider, you gain a long-term partner in health. Find a Primary Care Provider  Learn more about [facility]'s in-office and virtual care options: [facility] - Get Care Now

## 2023-05-29 ENCOUNTER — Other Ambulatory Visit: Payer: Self-pay | Admitting: Family Medicine

## 2023-05-29 DIAGNOSIS — Z1231 Encounter for screening mammogram for malignant neoplasm of breast: Secondary | ICD-10-CM

## 2023-06-13 ENCOUNTER — Ambulatory Visit
Admission: RE | Admit: 2023-06-13 | Discharge: 2023-06-13 | Disposition: A | Discharge status: Home / Self Care | Payer: Managed Care, Other (non HMO) | Source: Ambulatory Visit | Attending: Family Medicine | Admitting: Family Medicine

## 2023-06-13 DIAGNOSIS — Z1231 Encounter for screening mammogram for malignant neoplasm of breast: Secondary | ICD-10-CM

## 2023-06-18 ENCOUNTER — Other Ambulatory Visit: Payer: Self-pay | Admitting: Family Medicine

## 2023-06-18 DIAGNOSIS — R928 Other abnormal and inconclusive findings on diagnostic imaging of breast: Secondary | ICD-10-CM

## 2023-06-22 ENCOUNTER — Ambulatory Visit
Admission: RE | Admit: 2023-06-22 | Discharge: 2023-06-22 | Disposition: A | Discharge status: Home / Self Care | Payer: Managed Care, Other (non HMO) | Source: Ambulatory Visit | Attending: Family Medicine | Admitting: Family Medicine

## 2023-06-22 DIAGNOSIS — R928 Other abnormal and inconclusive findings on diagnostic imaging of breast: Secondary | ICD-10-CM

## 2023-06-27 ENCOUNTER — Other Ambulatory Visit: Payer: Managed Care, Other (non HMO)
